# Patient Record
Sex: Female | Born: 2005 | Race: Black or African American | Hispanic: No | Marital: Single | Smoking: Never smoker
Health system: Southern US, Community
[De-identification: ages and names within clinical notes are randomized; demographics above are authoritative.]

## PROBLEM LIST (undated history)

## (undated) DIAGNOSIS — J45909 Unspecified asthma, uncomplicated: Secondary | ICD-10-CM

---
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
## 2023-01-30 DIAGNOSIS — Z419 Encounter for procedure for purposes other than remedying health state, unspecified: Secondary | ICD-10-CM | POA: Diagnosis not present

## 2023-02-08 DIAGNOSIS — J209 Acute bronchitis, unspecified: Secondary | ICD-10-CM | POA: Diagnosis not present

## 2023-02-08 DIAGNOSIS — Z68.41 Body mass index (BMI) pediatric, 5th percentile to less than 85th percentile for age: Secondary | ICD-10-CM | POA: Diagnosis not present

## 2023-02-08 DIAGNOSIS — Z20828 Contact with and (suspected) exposure to other viral communicable diseases: Secondary | ICD-10-CM | POA: Diagnosis not present

## 2023-02-08 DIAGNOSIS — Z1152 Encounter for screening for COVID-19: Secondary | ICD-10-CM | POA: Diagnosis not present

## 2023-02-08 DIAGNOSIS — R0981 Nasal congestion: Secondary | ICD-10-CM | POA: Diagnosis not present

## 2023-03-01 ENCOUNTER — Emergency Department (HOSPITAL_BASED_OUTPATIENT_CLINIC_OR_DEPARTMENT_OTHER)
Admission: EM | Admit: 2023-03-01 | Discharge: 2023-03-01 | Disposition: A | Discharge status: Home / Self Care | Payer: Medicaid Other | Attending: Emergency Medicine | Admitting: Emergency Medicine

## 2023-03-01 ENCOUNTER — Other Ambulatory Visit: Payer: Self-pay

## 2023-03-01 ENCOUNTER — Encounter (HOSPITAL_BASED_OUTPATIENT_CLINIC_OR_DEPARTMENT_OTHER): Payer: Self-pay

## 2023-03-01 DIAGNOSIS — M549 Dorsalgia, unspecified: Secondary | ICD-10-CM | POA: Insufficient documentation

## 2023-03-01 DIAGNOSIS — R1030 Lower abdominal pain, unspecified: Secondary | ICD-10-CM

## 2023-03-01 DIAGNOSIS — R1031 Right lower quadrant pain: Secondary | ICD-10-CM | POA: Diagnosis not present

## 2023-03-01 DIAGNOSIS — R109 Unspecified abdominal pain: Secondary | ICD-10-CM | POA: Diagnosis not present

## 2023-03-01 DIAGNOSIS — J45909 Unspecified asthma, uncomplicated: Secondary | ICD-10-CM | POA: Insufficient documentation

## 2023-03-01 DIAGNOSIS — Z419 Encounter for procedure for purposes other than remedying health state, unspecified: Secondary | ICD-10-CM | POA: Diagnosis not present

## 2023-03-01 LAB — COMPREHENSIVE METABOLIC PANEL
ALT: 13 U/L (ref 0–44)
AST: 23 U/L (ref 15–41)
Albumin: 4.2 g/dL (ref 3.5–5.0)
Alkaline Phosphatase: 46 U/L (ref 38–126)
Anion gap: 10 (ref 5–15)
BUN: 15 mg/dL (ref 6–20)
CO2: 21 mmol/L — ABNORMAL LOW (ref 22–32)
Calcium: 8.9 mg/dL (ref 8.9–10.3)
Chloride: 104 mmol/L (ref 98–111)
Creatinine, Ser: 0.83 mg/dL (ref 0.44–1.00)
GFR, Estimated: >60 mL/min (ref >60)
Glucose, Bld: 117 mg/dL — ABNORMAL HIGH (ref 70–99)
Potassium: 3.5 mmol/L (ref 3.5–5.1)
Sodium: 135 mmol/L (ref 135–145)
Total Bilirubin: 0.7 mg/dL (ref 0.3–1.2)
Total Protein: 7.3 g/dL (ref 6.5–8.1)

## 2023-03-01 LAB — LIPASE, BLOOD: Lipase: 29 U/L (ref 11–51)

## 2023-03-01 LAB — URINALYSIS, ROUTINE W REFLEX MICROSCOPIC
Bilirubin Urine: NEGATIVE
Glucose, UA: NEGATIVE mg/dL
Ketones, ur: NEGATIVE mg/dL
Leukocytes,Ua: NEGATIVE
Nitrite: NEGATIVE
Protein, ur: 30 mg/dL — AB
Specific Gravity, Urine: >=1.03 (ref 1.005–1.030)
pH: 7 (ref 5.0–8.0)

## 2023-03-01 LAB — CBC
HCT: 38.1 % (ref 36.0–46.0)
Hemoglobin: 13.4 g/dL (ref 12.0–15.0)
MCH: 30.7 pg (ref 26.0–34.0)
MCHC: 35.2 g/dL (ref 30.0–36.0)
MCV: 87.4 fL (ref 80.0–100.0)
Platelets: 252 10*3/uL (ref 150–400)
RBC: 4.36 MIL/uL (ref 3.87–5.11)
RDW: 12 % (ref 11.5–15.5)
WBC: 7.4 10*3/uL (ref 4.0–10.5)
nRBC: 0 % (ref 0.0–0.2)

## 2023-03-01 LAB — URINALYSIS, MICROSCOPIC (REFLEX)

## 2023-03-01 LAB — PREGNANCY, URINE: Preg Test, Ur: NEGATIVE

## 2023-03-01 MED ORDER — ONDANSETRON 4 MG PO TBDP: 4.0000 mg | ORAL_TABLET | Freq: Three times a day (TID) | ORAL | 0 refills | Status: AC | PRN

## 2023-03-01 MED ORDER — ACETAMINOPHEN 325 MG PO TABS
ORAL_TABLET | ORAL | Status: AC
  Administered 2023-03-01: 650 mg via ORAL
  Filled 2023-03-01: qty 2

## 2023-03-01 MED ORDER — IBUPROFEN 200 MG PO TABS
ORAL_TABLET | ORAL | Status: AC
  Administered 2023-03-01: 800 mg via ORAL
  Filled 2023-03-01: qty 4

## 2023-03-01 MED ORDER — IBUPROFEN 800 MG PO TABS
800.0000 mg | ORAL_TABLET | Freq: Once | ORAL | Status: AC
  Filled 2023-03-01 (×2): qty 1

## 2023-03-01 MED ORDER — IBUPROFEN 600 MG PO TABS: 600.0000 mg | ORAL_TABLET | Freq: Four times a day (QID) | ORAL | 0 refills | Status: AC | PRN

## 2023-03-01 MED ORDER — ACETAMINOPHEN 325 MG PO TABS
650.0000 mg | ORAL_TABLET | Freq: Once | ORAL | Status: AC
  Filled 2023-03-01 (×2): qty 2

## 2023-03-01 MED ORDER — ONDANSETRON 4 MG PO TBDP
4.0000 mg | ORAL_TABLET | Freq: Once | ORAL | Status: AC | PRN
Start: 2023-03-01 — End: 2023-03-01
  Administered 2023-03-01: 4 mg via ORAL
  Filled 2023-03-01: qty 1

## 2023-03-01 MED ORDER — ACETAMINOPHEN 325 MG PO TABS: 650.0000 mg | ORAL_TABLET | Freq: Four times a day (QID) | ORAL | 0 refills | Status: AC | PRN

## 2023-03-01 NOTE — ED Notes (Signed)
Pt provided with graham crackers, per her request.

## 2023-03-01 NOTE — ED Triage Notes (Signed)
C/o right lower back pain, abdominal pain and emesis starting today. Started menstrual cycle today, states never gets pain like this. Unable to tolerate po.

## 2023-03-01 NOTE — ED Provider Notes (Signed)
[place] [facility] Provider Note   CSN: [id number] Arrival date & time: 03/01/23  1554     History  Chief Complaint  Patient presents with   Abdominal Pain   Back Pain    Hayley Hansen is a 17 y.o. female otherwise healthy presents today for evaluation of right-sided flank pain.  Patient reports that she started to have right-sided flank pain 2 hours ago with the onset of her menstrual cycle.  States she had pain in the same location when she had menstrual period but the pain this time is more intense.  Pain is waxing and waning.  States she is not sexually active. States she has not tried any medication at home.  She endorses nausea and vomiting.  Denies any fever, urinary symptoms, abnormal vaginal discharge.   Abdominal Pain Back Pain Associated symptoms: abdominal pain       Past Medical History:  Diagnosis Date   Asthma    History reviewed. No pertinent surgical history.   Home Medications Prior to Admission medications   Medication Sig Start Date End Date Taking? Authorizing Provider  azithromycin (ZITHROMAX) 250 MG tablet Take by mouth. Patient not taking: Reported on 03/03/2022 12/23/21   [provider]  cetirizine HCl (ZYRTEC) 5 MG/5ML SOLN TK 10 ML PO QD FOR RUNNY NOSE OR ITCHY EYES 02/22/22   Nehemiah Settle, FNP  famotidine (PEPCID) 20 MG tablet TK 1 T PO Q 12 H PRF HEARTBURN 10/03/19   [provider]  fluticasone (FLONASE) 50 MCG/ACT nasal spray 2 SPRAY PER NOSTRIL ONCE A DAY IF NEEDED FOR STUFFY NOSE 02/22/22   Nehemiah Settle, FNP  levalbuterol [facility] HFA) 45 MCG/ACT inhaler Inhale 2 puffs into the lungs every 6 (six) hours as needed for wheezing. 02/22/22   Nehemiah Settle, FNP  levonorgestrel-ethinyl estradiol (SEASONALE) 0.15-0.03 MG tablet Take 1 tablet by mouth daily. 12/06/21   [provider]  meclizine (ANTIVERT) 12.5 MG tablet Take 2 tablets (25 mg total) by mouth 3 (three) times daily as  needed for dizziness. 01/13/22   Roemhildt, Lorin T, PA-C  montelukast (SINGULAIR) 10 MG tablet Take 1 tablet once at night for coughing or wheezing 02/22/22   Nehemiah Settle, FNP  naproxen (NAPROSYN) 500 MG tablet TAKE 1 TABLET BY MOUTH EVERY 12 HOURS FOR TEN DAYS AS NEEDED FOR CRAMPING 11/20/21   [provider]  Olopatadine HCl (PATADAY) 0.2 % SOLN 1 drop each eye once a day if needed for itchy eyes 02/22/22   Nehemiah Settle, FNP  polyethylene glycol powder (GLYCOLAX/MIRALAX) 17 GM/SCOOP powder  10/03/19   [provider]  ranitidine (ZANTAC) 150 MG capsule Take 1 capsule (150 mg total) by mouth daily. 04/23/18   Horton, Mayer Masker, MD  fluticasone (FLOVENT HFA) 44 MCG/ACT inhaler Inhale 2 puffs into the lungs 2 (two) times daily. Patient not taking: Reported on 01/29/2018 07/31/17 02/13/20  Fletcher Anon, MD  levalbuterol [facility]) 1.25 MG/3ML nebulizer solution USE ONE UNIT DOSE EVERY 6 HOURS IF NEEDED FOR COUGH OR WHEEZE Patient not taking: Reported on 07/30/2018 01/31/18 02/13/20  Fletcher Anon, MD      Allergies    Albuterol sulfate    Review of Systems   Review of Systems  Gastrointestinal:  Positive for abdominal pain.  Musculoskeletal:  Positive for back pain.    Physical Exam Updated Vital Signs BP (!) 104/92 (BP Location: Left Arm)   Pulse 87   Temp 98.7 F (37.1 C)   Resp  20   Ht 5\' 4"  (1.626 m)   Wt 54.4 kg   LMP 03/01/2023 (Exact Date)   SpO2 100%   BMI 20.60 kg/m  Physical Exam Vitals and nursing note reviewed.  Constitutional:      Appearance: Normal appearance.  HENT:     Head: Normocephalic and atraumatic.     Mouth/Throat:     Mouth: Mucous membranes are moist.  Eyes:     General: No scleral icterus. Cardiovascular:     Rate and Rhythm: Normal rate and regular rhythm.     Pulses: Normal pulses.     Heart sounds: Normal heart sounds.  Pulmonary:     Effort: Pulmonary effort is normal.     Breath sounds: Normal breath sounds.   Abdominal:     General: Abdomen is flat.     Palpations: Abdomen is soft.     Tenderness: There is no abdominal tenderness.  Musculoskeletal:        General: No deformity.     Comments: Tenderness to palpation to right-sided flank.  Skin:    General: Skin is warm.     Findings: No rash.  Neurological:     General: No focal deficit present.     Mental Status: She is alert.  Psychiatric:        Mood and Affect: Mood normal.     ED Results / Procedures / Treatments   Labs (all labs ordered are listed, but only abnormal results are displayed) Labs Reviewed  COMPREHENSIVE METABOLIC PANEL - Abnormal; Notable for the following components:      Result Value   CO2 21 (*)    Glucose, Bld 117 (*)    All other components within normal limits  URINALYSIS, ROUTINE W REFLEX MICROSCOPIC - Abnormal; Notable for the following components:   Hgb urine dipstick LARGE (*)    Protein, ur 30 (*)    All other components within normal limits  URINALYSIS, MICROSCOPIC (REFLEX) - Abnormal; Notable for the following components:   Bacteria, UA RARE (*)    All other components within normal limits  LIPASE, BLOOD  CBC  PREGNANCY, URINE    EKG None  Radiology No results found.  Procedures Procedures    Medications Ordered in ED Medications  acetaminophen (TYLENOL) tablet 650 mg (has no administration in time range)  ibuprofen (ADVIL) tablet 800 mg (has no administration in time range)  ondansetron (ZOFRAN-ODT) disintegrating tablet 4 mg (4 mg Oral Given 03/01/23 1601)    ED Course/ Medical Decision Making/ A&P                             Medical Decision Making Amount and/or Complexity of Data Reviewed Labs: ordered.  Risk OTC drugs. Prescription drug management.   This patient presents to the ED for right-sided flank pain, this involves an extensive number of treatment options, and is a complaint that carries with a high risk of complications and morbidity.  The differential  diagnosis includes appendicitis, diverticulitis, ovarian torsion, ectopic pregnancy, molar pregnancy, STI, UTI, SBO, infectious etiology.  This is not an exhaustive list.  Lab tests: I ordered and personally interpreted labs.  The pertinent results include: WBC unremarkable. Hbg unremarkable. Platelets unremarkable. Electrolytes unremarkable. BUN, creatinine unremarkable.  Pregnancy test negative.  Problem list/ ED course/ Critical interventions/ Medical management: HPI: See above Vital signs within normal range and stable throughout visit. Laboratory/imaging studies significant for: See above. On physical examination, patient is  afebrile and appears in no acute distress. Patient presents with lower abdominal pain and R flank pain. Abdominal exam without peritoneal signs. No evidence of acute abdomen at this time. Well appearing.  Discussed pelvic exam with patient however she refused at this point. Considered and doubt ovarian torsion given history and presentation. Given work up low suspicion for acute hepatobiliary disease (including acute cholecystitis), acute pancreatitis (neg lipase), PUD and gastric perforation, acute infectious processes (pneumonia, hepatitis, pyelonephritis), acute appendicitis, vascular catastrophe, bowel obstruction or viscus perforation, diverticulitis. Presentation not consistent with other acute, emergent causes of abdominal pain at this time.  I suspect this is musculoskeletal pain.  Given Tylenol, ibuprofen, Zofran.  Reevaluation of patient after this medication showed that the patient improved. Advised patient to take Tylenol/ibuprofen/naproxen for pain, follow-up with primary care physician for further evaluation and management, return to the ER if new or worsening symptoms.  I have reviewed the patient home medicines and have made adjustments as needed.  Cardiac monitoring/EKG: The patient was maintained on a cardiac monitor.  I personally reviewed and interpreted  the cardiac monitor which showed an underlying rhythm of: sinus rhythm.  Additional history obtained: External records from outside source obtained and reviewed including: Chart review including previous notes, labs, imaging.  Consultations obtained:  Disposition Continued outpatient therapy. Follow-up with PCP recommended for reevaluation of symptoms. Treatment plan discussed with patient.  Pt acknowledged understanding was agreeable to the plan. Worrisome signs and symptoms were discussed with patient, and patient acknowledged understanding to return to the ED if they noticed these signs and symptoms. Patient was stable upon discharge.   This chart was dictated using voice recognition software.  Despite best efforts to proofread,  errors can occur which can change the documentation meaning.          Final Clinical Impression(s) / ED Diagnoses Final diagnoses:  Right flank pain  Lower abdominal pain    Rx / DC Orders ED Discharge Orders          Ordered    ondansetron (ZOFRAN-ODT) 4 MG disintegrating tablet  Every 8 hours PRN        03/01/23 1907    acetaminophen (TYLENOL) 325 MG tablet  Every 6 hours PRN        03/01/23 1907    ibuprofen (ADVIL) 600 MG tablet  Every 6 hours PRN        03/01/23 1907              Jeanelle Malling, PA 03/01/23 1913    Tegeler, Canary Brim, MD 03/01/23 [phone]

## 2023-03-01 NOTE — ED Notes (Signed)
D/c paperwork reviewed with pt, including prescriptions and follow up care.  No questions or concerns voiced at time of d/c. . Pt verbalized understanding, Ambulatory with family to ED exit, NAD.   

## 2023-03-01 NOTE — Discharge Instructions (Addendum)
Please take your medications as prescribed. Take tylenol/ibuprofen up to three times a day for pain. I recommend close follow-up with PCP for reevaluation.  Please do not hesitate to return to emergency department if worrisome signs symptoms we discussed become apparent.

## 2023-04-01 DIAGNOSIS — Z419 Encounter for procedure for purposes other than remedying health state, unspecified: Secondary | ICD-10-CM | POA: Diagnosis not present

## 2023-05-01 DIAGNOSIS — Z419 Encounter for procedure for purposes other than remedying health state, unspecified: Secondary | ICD-10-CM | POA: Diagnosis not present

## 2023-06-01 DIAGNOSIS — Z419 Encounter for procedure for purposes other than remedying health state, unspecified: Secondary | ICD-10-CM | POA: Diagnosis not present

## 2023-06-20 ENCOUNTER — Emergency Department (HOSPITAL_BASED_OUTPATIENT_CLINIC_OR_DEPARTMENT_OTHER)
Admission: EM | Admit: 2023-06-20 | Discharge: 2023-06-20 | Disposition: A | Discharge status: Home / Self Care | Payer: Medicaid Other | Attending: Emergency Medicine | Admitting: Emergency Medicine

## 2023-06-20 ENCOUNTER — Encounter (HOSPITAL_BASED_OUTPATIENT_CLINIC_OR_DEPARTMENT_OTHER): Payer: Self-pay | Admitting: Urology

## 2023-06-20 ENCOUNTER — Other Ambulatory Visit: Payer: Self-pay

## 2023-06-20 DIAGNOSIS — B9789 Other viral agents as the cause of diseases classified elsewhere: Secondary | ICD-10-CM | POA: Diagnosis not present

## 2023-06-20 DIAGNOSIS — J028 Acute pharyngitis due to other specified organisms: Secondary | ICD-10-CM | POA: Diagnosis not present

## 2023-06-20 DIAGNOSIS — J029 Acute pharyngitis, unspecified: Secondary | ICD-10-CM | POA: Diagnosis present

## 2023-06-20 DIAGNOSIS — Z20822 Contact with and (suspected) exposure to covid-19: Secondary | ICD-10-CM | POA: Diagnosis not present

## 2023-06-20 LAB — RESP PANEL BY RT-PCR (RSV, FLU A&B, COVID)  RVPGX2
Influenza A by PCR: NEGATIVE
Influenza B by PCR: NEGATIVE
Resp Syncytial Virus by PCR: NEGATIVE
SARS Coronavirus 2 by RT PCR: NEGATIVE

## 2023-06-20 LAB — GROUP A STREP BY PCR: Group A Strep by PCR: NOT DETECTED

## 2023-06-20 MED ORDER — IBUPROFEN 200 MG PO TABS
ORAL_TABLET | ORAL | Status: AC
  Filled 2023-06-20: qty 1

## 2023-06-20 MED ORDER — IBUPROFEN 400 MG PO TABS
600.0000 mg | ORAL_TABLET | Freq: Once | ORAL | Status: AC
  Administered 2023-06-20: 600 mg via ORAL

## 2023-06-20 MED ORDER — IBUPROFEN 400 MG PO TABS
ORAL_TABLET | ORAL | Status: AC
  Filled 2023-06-20: qty 1

## 2023-06-20 NOTE — ED Provider Notes (Signed)
[place] [facility] Provider Note   CSN: [id number] Arrival date & time: 06/20/23  2030     History  Chief Complaint  Patient presents with   Sore Throat    Hayley Hansen is a 17 y.o. female.  Patient woke this morning with a bilateral sore throat and developed back and muscular pain throughout the day. No nausea, vomiting, urinary symptoms, cough or congestion. No known sick contacts. No rash. She denies vaginal discharge. She is otherwise healthy, nonsmoker.   The history is provided by the patient. No language interpreter was used.  Sore Throat       Home Medications Prior to Admission medications   Medication Sig Start Date End Date Taking? Authorizing Provider  acetaminophen (TYLENOL) 325 MG tablet Take 2 tablets (650 mg total) by mouth every 6 (six) hours as needed. 03/01/23   Jeanelle Malling, PA  azithromycin (ZITHROMAX) 250 MG tablet Take by mouth. Patient not taking: Reported on 03/03/2022 12/23/21   [provider]  cetirizine HCl (ZYRTEC) 5 MG/5ML SOLN TK 10 ML PO QD FOR RUNNY NOSE OR ITCHY EYES 02/22/22   Nehemiah Settle, FNP  famotidine (PEPCID) 20 MG tablet TK 1 T PO Q 12 H PRF HEARTBURN 10/03/19   [provider]  fluticasone (FLONASE) 50 MCG/ACT nasal spray 2 SPRAY PER NOSTRIL ONCE A DAY IF NEEDED FOR STUFFY NOSE 02/22/22   Nehemiah Settle, FNP  ibuprofen (ADVIL) 600 MG tablet Take 1 tablet (600 mg total) by mouth every 6 (six) hours as needed. 03/01/23   Jeanelle Malling, PA  levalbuterol [facility] HFA) 45 MCG/ACT inhaler Inhale 2 puffs into the lungs every 6 (six) hours as needed for wheezing. 02/22/22   Nehemiah Settle, FNP  levonorgestrel-ethinyl estradiol (SEASONALE) 0.15-0.03 MG tablet Take 1 tablet by mouth daily. 12/06/21   [provider]  meclizine (ANTIVERT) 12.5 MG tablet Take 2 tablets (25 mg total) by mouth 3 (three) times daily as needed for dizziness. 01/13/22   Roemhildt, Lorin T, PA-C  montelukast (SINGULAIR)  10 MG tablet Take 1 tablet once at night for coughing or wheezing 02/22/22   Nehemiah Settle, FNP  naproxen (NAPROSYN) 500 MG tablet TAKE 1 TABLET BY MOUTH EVERY 12 HOURS FOR TEN DAYS AS NEEDED FOR CRAMPING 11/20/21   [provider]  Olopatadine HCl (PATADAY) 0.2 % SOLN 1 drop each eye once a day if needed for itchy eyes 02/22/22   Nehemiah Settle, FNP  ondansetron (ZOFRAN-ODT) 4 MG disintegrating tablet Take 1 tablet (4 mg total) by mouth every 8 (eight) hours as needed for nausea or vomiting. 03/01/23   Jeanelle Malling, PA  polyethylene glycol powder (GLYCOLAX/MIRALAX) 17 GM/SCOOP powder  10/03/19   [provider]  ranitidine (ZANTAC) 150 MG capsule Take 1 capsule (150 mg total) by mouth daily. 04/23/18   Horton, Mayer Masker, MD  fluticasone (FLOVENT HFA) 44 MCG/ACT inhaler Inhale 2 puffs into the lungs 2 (two) times daily. Patient not taking: Reported on 01/29/2018 07/31/17 02/13/20  Fletcher Anon, MD  levalbuterol (XOPENEX) 1.25 MG/3ML nebulizer solution USE ONE UNIT DOSE EVERY 6 HOURS IF NEEDED FOR COUGH OR WHEEZE Patient not taking: Reported on 07/30/2018 01/31/18 02/13/20  Fletcher Anon, MD      Allergies    Albuterol sulfate    Review of Systems   Review of Systems  Physical Exam Updated Vital Signs BP 102/63 (BP Location: Left Arm)   Pulse (!) 112   Temp 100.2 F (37.9 C) (Oral)  Resp 20   Ht 5\' 4"  (1.626 m)   Wt 53.3 kg   LMP 05/23/2023   SpO2 100%   BMI 20.17 kg/m  Physical Exam Vitals and nursing note reviewed.  Constitutional:      General: She is not in acute distress. HENT:     Nose: No congestion or rhinorrhea.     Mouth/Throat:     Mouth: Mucous membranes are moist.     Pharynx: Posterior oropharyngeal erythema present. No pharyngeal swelling or oropharyngeal exudate.     Tonsils: No tonsillar exudate or tonsillar abscesses.  Cardiovascular:     Rate and Rhythm: Regular rhythm.     Heart sounds: No murmur heard. Pulmonary:     Effort: Pulmonary effort  is normal.     Breath sounds: Normal breath sounds.  Abdominal:     General: There is no distension.     Palpations: Abdomen is soft.  Musculoskeletal:     Cervical back: Normal range of motion.  Lymphadenopathy:     Cervical: No cervical adenopathy.  Skin:    General: Skin is warm and dry.     ED Results / Procedures / Treatments   Labs (all labs ordered are listed, but only abnormal results are displayed) Labs Reviewed  RESP PANEL BY RT-PCR (RSV, FLU A&B, COVID)  [id number]  GROUP A STREP BY PCR    EKG None  Radiology No results found.  Procedures Procedures    Medications Ordered in ED Medications - No data to display  ED Course/ Medical Decision Making/ A&P                                 Medical Decision Making Patient with viral symptoms of sore throat and body aches x 1 day. No fever in ED, mildly tachycardic. Well appearing. Eating and drinking in the room. Viral panel and strep swabs negative.   She is felt stable for discharge home with supportive care. Tylenol and ibuprofen. Push fluids. Return if symptoms worsen.   Amount and/or Complexity of Data Reviewed Labs:  Decision-making details documented in ED Course.           Final Clinical Impression(s) / ED Diagnoses Final diagnoses:  Viral pharyngitis    Rx / DC Orders ED Discharge Orders     None         Danne Harbor 06/20/23 2146    Maia Plan, MD 06/20/23 2358

## 2023-06-20 NOTE — ED Triage Notes (Signed)
Pt states sore throat when waking up today  Since has had back pain and body aches Denies fever

## 2023-07-02 DIAGNOSIS — Z419 Encounter for procedure for purposes other than remedying health state, unspecified: Secondary | ICD-10-CM | POA: Diagnosis not present

## 2023-08-01 DIAGNOSIS — Z419 Encounter for procedure for purposes other than remedying health state, unspecified: Secondary | ICD-10-CM | POA: Diagnosis not present

## 2023-09-01 DIAGNOSIS — Z419 Encounter for procedure for purposes other than remedying health state, unspecified: Secondary | ICD-10-CM | POA: Diagnosis not present

## 2023-09-14 ENCOUNTER — Ambulatory Visit: Payer: Medicaid Other | Admitting: Family

## 2023-10-01 DIAGNOSIS — Z419 Encounter for procedure for purposes other than remedying health state, unspecified: Secondary | ICD-10-CM | POA: Diagnosis not present

## 2023-11-01 DIAGNOSIS — Z419 Encounter for procedure for purposes other than remedying health state, unspecified: Secondary | ICD-10-CM | POA: Diagnosis not present

## 2023-11-03 DIAGNOSIS — Z79899 Other long term (current) drug therapy: Secondary | ICD-10-CM | POA: Diagnosis not present

## 2023-11-03 DIAGNOSIS — R1900 Intra-abdominal and pelvic swelling, mass and lump, unspecified site: Secondary | ICD-10-CM | POA: Diagnosis not present

## 2023-11-03 DIAGNOSIS — Z113 Encounter for screening for infections with a predominantly sexual mode of transmission: Secondary | ICD-10-CM | POA: Diagnosis not present

## 2023-11-03 DIAGNOSIS — R109 Unspecified abdominal pain: Secondary | ICD-10-CM | POA: Diagnosis not present

## 2023-11-03 DIAGNOSIS — K5900 Constipation, unspecified: Secondary | ICD-10-CM | POA: Diagnosis not present

## 2023-11-03 DIAGNOSIS — Z68.41 Body mass index (BMI) pediatric, 5th percentile to less than 85th percentile for age: Secondary | ICD-10-CM | POA: Diagnosis not present

## 2023-11-03 DIAGNOSIS — R1013 Epigastric pain: Secondary | ICD-10-CM | POA: Diagnosis not present

## 2023-11-03 DIAGNOSIS — Z202 Contact with and (suspected) exposure to infections with a predominantly sexual mode of transmission: Secondary | ICD-10-CM | POA: Diagnosis not present

## 2023-11-03 DIAGNOSIS — R8281 Pyuria: Secondary | ICD-10-CM | POA: Diagnosis not present

## 2023-11-03 DIAGNOSIS — R1011 Right upper quadrant pain: Secondary | ICD-10-CM | POA: Diagnosis not present

## 2023-11-07 DIAGNOSIS — M419 Scoliosis, unspecified: Secondary | ICD-10-CM | POA: Diagnosis not present

## 2023-11-07 DIAGNOSIS — K5900 Constipation, unspecified: Secondary | ICD-10-CM | POA: Diagnosis not present

## 2023-11-09 DIAGNOSIS — K5900 Constipation, unspecified: Secondary | ICD-10-CM | POA: Diagnosis not present

## 2023-11-09 DIAGNOSIS — J4530 Mild persistent asthma, uncomplicated: Secondary | ICD-10-CM | POA: Diagnosis not present

## 2023-11-09 DIAGNOSIS — R82998 Other abnormal findings in urine: Secondary | ICD-10-CM | POA: Diagnosis not present

## 2023-11-09 DIAGNOSIS — Z68.41 Body mass index (BMI) pediatric, 5th percentile to less than 85th percentile for age: Secondary | ICD-10-CM | POA: Diagnosis not present

## 2023-11-09 DIAGNOSIS — Z0000 Encounter for general adult medical examination without abnormal findings: Secondary | ICD-10-CM | POA: Diagnosis not present

## 2023-11-09 DIAGNOSIS — Z23 Encounter for immunization: Secondary | ICD-10-CM | POA: Diagnosis not present

## 2023-12-02 DIAGNOSIS — Z419 Encounter for procedure for purposes other than remedying health state, unspecified: Secondary | ICD-10-CM | POA: Diagnosis not present

## 2023-12-28 DIAGNOSIS — R509 Fever, unspecified: Secondary | ICD-10-CM | POA: Diagnosis not present

## 2023-12-28 DIAGNOSIS — R059 Cough, unspecified: Secondary | ICD-10-CM | POA: Diagnosis not present

## 2023-12-28 DIAGNOSIS — B349 Viral infection, unspecified: Secondary | ICD-10-CM | POA: Diagnosis not present

## 2023-12-30 DIAGNOSIS — Z419 Encounter for procedure for purposes other than remedying health state, unspecified: Secondary | ICD-10-CM | POA: Diagnosis not present

## 2024-02-10 DIAGNOSIS — Z419 Encounter for procedure for purposes other than remedying health state, unspecified: Secondary | ICD-10-CM | POA: Diagnosis not present

## 2024-03-11 DIAGNOSIS — Z419 Encounter for procedure for purposes other than remedying health state, unspecified: Secondary | ICD-10-CM | POA: Diagnosis not present

## 2024-04-11 DIAGNOSIS — Z419 Encounter for procedure for purposes other than remedying health state, unspecified: Secondary | ICD-10-CM | POA: Diagnosis not present

## 2024-05-11 DIAGNOSIS — Z419 Encounter for procedure for purposes other than remedying health state, unspecified: Secondary | ICD-10-CM | POA: Diagnosis not present

## 2024-06-11 DIAGNOSIS — Z419 Encounter for procedure for purposes other than remedying health state, unspecified: Secondary | ICD-10-CM | POA: Diagnosis not present

## 2024-06-24 ENCOUNTER — Other Ambulatory Visit: Payer: Self-pay

## 2024-06-24 ENCOUNTER — Encounter (HOSPITAL_BASED_OUTPATIENT_CLINIC_OR_DEPARTMENT_OTHER): Payer: Self-pay | Admitting: Emergency Medicine

## 2024-06-24 ENCOUNTER — Emergency Department (HOSPITAL_BASED_OUTPATIENT_CLINIC_OR_DEPARTMENT_OTHER)

## 2024-06-24 ENCOUNTER — Emergency Department (HOSPITAL_BASED_OUTPATIENT_CLINIC_OR_DEPARTMENT_OTHER): Admission: EM | Admit: 2024-06-24 | Discharge: 2024-06-24 | Disposition: A | Discharge status: Home / Self Care

## 2024-06-24 DIAGNOSIS — J45909 Unspecified asthma, uncomplicated: Secondary | ICD-10-CM | POA: Diagnosis not present

## 2024-06-24 DIAGNOSIS — U071 COVID-19: Secondary | ICD-10-CM | POA: Diagnosis not present

## 2024-06-24 DIAGNOSIS — R059 Cough, unspecified: Secondary | ICD-10-CM | POA: Diagnosis present

## 2024-06-24 LAB — CBC
HCT: 40.4 % (ref 36.0–46.0)
Hemoglobin: 13.9 g/dL (ref 12.0–15.0)
MCH: 30.2 pg (ref 26.0–34.0)
MCHC: 34.4 g/dL (ref 30.0–36.0)
MCV: 87.6 fL (ref 80.0–100.0)
Platelets: 246 K/uL (ref 150–400)
RBC: 4.61 MIL/uL (ref 3.87–5.11)
RDW: 12.1 % (ref 11.5–15.5)
WBC: 5.7 K/uL (ref 4.0–10.5)
nRBC: 0 % (ref 0.0–0.2)

## 2024-06-24 LAB — BASIC METABOLIC PANEL WITH GFR
Anion gap: 12 (ref 5–15)
BUN: 13 mg/dL (ref 6–20)
CO2: 24 mmol/L (ref 22–32)
Calcium: 9.6 mg/dL (ref 8.9–10.3)
Chloride: 105 mmol/L (ref 98–111)
Creatinine, Ser: 0.91 mg/dL (ref 0.44–1.00)
GFR, Estimated: >60 mL/min (ref >60)
Glucose, Bld: 93 mg/dL (ref 70–99)
Potassium: 3.9 mmol/L (ref 3.5–5.1)
Sodium: 142 mmol/L (ref 135–145)

## 2024-06-24 LAB — RESP PANEL BY RT-PCR (RSV, FLU A&B, COVID)  RVPGX2
Influenza A by PCR: NEGATIVE
Influenza B by PCR: NEGATIVE
Resp Syncytial Virus by PCR: NEGATIVE
SARS Coronavirus 2 by RT PCR: POSITIVE — AB

## 2024-06-24 LAB — PREGNANCY, URINE: Preg Test, Ur: NEGATIVE

## 2024-06-24 LAB — TROPONIN T, HIGH SENSITIVITY
Troponin T High Sensitivity: <15 ng/L (ref 0–19)
Troponin T High Sensitivity: <15 ng/L (ref 0–19)

## 2024-06-24 MED ORDER — ALBUTEROL SULFATE HFA 108 (90 BASE) MCG/ACT IN AERS: 2.0000 | INHALATION_SPRAY | RESPIRATORY_TRACT | Status: DC | PRN

## 2024-06-24 MED ORDER — PREDNISONE 50 MG PO TABS
60.0000 mg | ORAL_TABLET | Freq: Once | ORAL | Status: AC
  Administered 2024-06-24: 60 mg via ORAL
  Filled 2024-06-24: qty 1

## 2024-06-24 MED ORDER — ALBUTEROL SULFATE HFA 108 (90 BASE) MCG/ACT IN AERS
INHALATION_SPRAY | RESPIRATORY_TRACT | Status: AC
  Administered 2024-06-24: 2 via RESPIRATORY_TRACT
  Filled 2024-06-24: qty 6.7

## 2024-06-24 MED ORDER — ALBUTEROL SULFATE HFA 108 (90 BASE) MCG/ACT IN AERS: 1.0000 | INHALATION_SPRAY | Freq: Four times a day (QID) | RESPIRATORY_TRACT | 0 refills | Status: AC | PRN

## 2024-06-24 MED ORDER — PREDNISONE 50 MG PO TABS
50.0000 mg | ORAL_TABLET | Freq: Every day | ORAL | 0 refills | Status: AC
Start: 2024-06-24 — End: 2024-06-28

## 2024-06-24 NOTE — ED Triage Notes (Signed)
 Pt c/o shob since this AM, denies fever. Been out of home mdi for extended period. Reports non productive cough. Reports intermittent chest pain/burning with cough.  Poor po intake.   RT to triage to assess.

## 2024-06-24 NOTE — Discharge Instructions (Signed)
 It was a pleasure taking care of you here today.  Your COVID test is positive which is likely the cause of your symptoms.  I have written you for a short course of steroids.  Use your inhaler every 4 hours as needed  Return for any worsening symptoms

## 2024-06-24 NOTE — ED Provider Notes (Signed)
 [place] [facility] Provider Note   CSN: [id number] Arrival date & time: 06/24/24  1850     Patient presents with: Shortness of Breath   Hayley Hansen is a 18 y.o. female here for evaluation of shortness of breath and cough.  History of asthma has been out of rescue inhaler for extended period of time.  Cough nonproductive.  Started middle of the night.  Intermittent chest pain when coughing however no pain with exertion or pleuritic chest pain.  Has had overall decreased appetite.  No fever, nausea, vomiting, back pain, dysuria hematuria.  No history of PE or DVT.  No pain or swelling to lower extremities.  No meds PTA.   HPI     Prior to Admission medications   Medication Sig Start Date End Date Taking? Authorizing Provider  albuterol  (VENTOLIN  HFA) 108 (90 Base) MCG/ACT inhaler Inhale 1-2 puffs into the lungs every 6 (six) hours as needed for wheezing or shortness of breath. 06/24/24  Yes Chantella Creech A, PA-C  predniSONE  (DELTASONE ) 50 MG tablet Take 1 tablet (50 mg total) by mouth daily for 4 days. 06/24/24 06/28/24 Yes Yama Nielson A, PA-C  acetaminophen  (TYLENOL ) 325 MG tablet Take 2 tablets (650 mg total) by mouth every 6 (six) hours as needed. 03/01/23   Ladora Congress, PA  azithromycin (ZITHROMAX) 250 MG tablet Take by mouth. Patient not taking: Reported on 03/03/2022 12/23/21   [provider]  cetirizine  HCl (ZYRTEC ) 5 MG/5ML SOLN TK 10 ML PO QD FOR RUNNY NOSE OR ITCHY EYES 02/22/22   Cheryl Reusing, FNP  famotidine  (PEPCID ) 20 MG tablet TK 1 T PO Q 12 H PRF HEARTBURN 10/03/19   [provider]  fluticasone  (FLONASE ) 50 MCG/ACT nasal spray 2 SPRAY PER NOSTRIL ONCE A DAY IF NEEDED FOR STUFFY NOSE 02/22/22   Cheryl Reusing, FNP  ibuprofen  (ADVIL ) 600 MG tablet Take 1 tablet (600 mg total) by mouth every 6 (six) hours as needed. 03/01/23   Ladora Congress, PA  levalbuterol  (XOPENEX  HFA) 45 MCG/ACT inhaler Inhale 2 puffs into the lungs every  6 (six) hours as needed for wheezing. 02/22/22   Cheryl Reusing, FNP  levonorgestrel-ethinyl estradiol (SEASONALE) 0.15-0.03 MG tablet Take 1 tablet by mouth daily. 12/06/21   [provider]  meclizine  (ANTIVERT ) 12.5 MG tablet Take 2 tablets (25 mg total) by mouth 3 (three) times daily as needed for dizziness. 01/13/22   Roemhildt, Lorin T, PA-C  montelukast  (SINGULAIR ) 10 MG tablet Take 1 tablet once at night for coughing or wheezing 02/22/22   Cheryl Reusing, FNP  naproxen  (NAPROSYN ) 500 MG tablet TAKE 1 TABLET BY MOUTH EVERY 12 HOURS FOR TEN DAYS AS NEEDED FOR CRAMPING 11/20/21   [provider]  Olopatadine  HCl (PATADAY ) 0.2 % SOLN 1 drop each eye once a day if needed for itchy eyes 02/22/22   Cheryl Reusing, FNP  ondansetron  (ZOFRAN -ODT) 4 MG disintegrating tablet Take 1 tablet (4 mg total) by mouth every 8 (eight) hours as needed for nausea or vomiting. 03/01/23   Ladora Congress, PA  polyethylene glycol powder (GLYCOLAX /MIRALAX ) 17 GM/SCOOP powder  10/03/19   [provider]  ranitidine  (ZANTAC ) 150 MG capsule Take 1 capsule (150 mg total) by mouth daily. 04/23/18   Horton, Charmaine FALCON, MD  fluticasone  (FLOVENT  HFA) 44 MCG/ACT inhaler Inhale 2 puffs into the lungs 2 (two) times daily. Patient not taking: Reported on 01/29/2018 07/31/17 02/13/20  Asa Aloysius LABOR, MD  levalbuterol  (XOPENEX ) 1.25 MG/3ML nebulizer  solution USE ONE UNIT DOSE EVERY 6 HOURS IF NEEDED FOR COUGH OR WHEEZE Patient not taking: Reported on 07/30/2018 01/31/18 02/13/20  Asa Aloysius LABOR, MD    Allergies: Albuterol  sulfate    Review of Systems  Constitutional:  Positive for activity change, appetite change and fatigue.  HENT:  Negative for sore throat and trouble swallowing.   Respiratory:  Positive for cough, shortness of breath and wheezing.   Cardiovascular: Negative.   Gastrointestinal: Negative.   Genitourinary: Negative.   Musculoskeletal: Negative.   Skin: Negative.   Neurological: Negative.   All  other systems reviewed and are negative.   Updated Vital Signs BP 107/74   Pulse 82   Temp 98 F (36.7 C)   Resp 18   Ht 5' 4 (1.626 m)   LMP 06/21/2024 (Approximate)   SpO2 100%   BMI 20.17 kg/m   Physical Exam Vitals and nursing note reviewed.  Constitutional:      General: She is not in acute distress.    Appearance: She is well-developed. She is not ill-appearing, toxic-appearing or diaphoretic.  HENT:     Head: Normocephalic and atraumatic.  Eyes:     Pupils: Pupils are equal, round, and reactive to light.  Cardiovascular:     Rate and Rhythm: Normal rate.     Pulses: Normal pulses.     Heart sounds: Normal heart sounds.  Pulmonary:     Effort: Pulmonary effort is normal. No respiratory distress.     Breath sounds: Normal breath sounds.     Comments: Clear bilaterally, speaks in full sentences without difficulty Chest:     Comments: Nontender chest wall Abdominal:     General: Bowel sounds are normal. There is no distension.     Palpations: Abdomen is soft.     Tenderness: There is no abdominal tenderness. There is no guarding or rebound.     Comments: Soft, nontender, no rebound or guarding  Musculoskeletal:        General: Normal range of motion.     Cervical back: Normal range of motion.     Right lower leg: No tenderness. No edema.     Left lower leg: No tenderness. No edema.     Comments: No bony tenderness, compartments soft, full range of motion  Skin:    General: Skin is warm and dry.  Neurological:     General: No focal deficit present.     Mental Status: She is alert.  Psychiatric:        Mood and Affect: Mood normal.     (all labs ordered are listed, but only abnormal results are displayed) Labs Reviewed  RESP PANEL BY RT-PCR (RSV, FLU A&B, COVID)  RVPGX2 - Abnormal; Notable for the following components:      Result Value   SARS Coronavirus 2 by RT PCR POSITIVE (*)    All other components within normal limits  PREGNANCY, URINE  BASIC  METABOLIC PANEL WITH GFR  CBC  TROPONIN T, HIGH SENSITIVITY  TROPONIN T, HIGH SENSITIVITY    EKG: None  Radiology: DG Chest 2 View Result Date: 06/24/2024 CLINICAL DATA:  Shortness of breath since this morning. Nonproductive cough. Chest pain and burning. History of asthma. EXAM: CHEST - 2 VIEW COMPARISON:  11/07/2023 FINDINGS: The heart size and mediastinal contours are within normal limits. Both lungs are clear. The visualized skeletal structures are unremarkable. IMPRESSION: No active cardiopulmonary disease. Electronically Signed   By: Elsie Gravely M.D.   On: 06/24/2024 20:17  Procedures   Medications Ordered in the ED  albuterol  (VENTOLIN  HFA) 108 (90 Base) MCG/ACT inhaler 2 puff (2 puffs Inhalation Given 06/24/24 1906)  predniSONE  (DELTASONE ) tablet 60 mg (60 mg Oral Given 06/24/24 712)   18 year old here for evaluation of cough and shortness of breath.  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  Reportedly was seen by respiratory therapist in triage and was wheezing.  Was given albuterol  treatment which improved symptoms.  When I initially evaluated patient she had no recurrent wheeze.  She is no clinical evidence of DVT.  Does admit to some chest pain only with coughing.  Nonexertional, nonpleuritic in nature.  She is PERC negative, Wells criteria low risk.  Workup started from triage.  Labs and imaging personally viewed and  interpreted:  EKG without ischemic changes CBC without leukocytosis Metabolic panel without significant abnormality Troponin <15 x2 flat delta Chest x-ray without cardiomegaly, pulm edema, pneumothorax Viral panel positive for COVID  Patient reassessed.  We discussed labs and imaging.  Patient given prednisone  for asthma exacerbation as well as concomitant COVID infection.  She was given albuterol  MDI to take home with her.  Will have her follow-up outpatient, return for any worsening symptoms  The patient has been appropriately medically screened  and/or stabilized in the ED. I have low suspicion for any other emergent medical condition which would require further screening, evaluation or treatment in the ED or require inpatient management.  Low suspicion for secondary bacterial infection, PE, pneumothorax, fluid overload, persistent asthma exacerbation, hypoxic respiratory failure, myocarditis, pericarditis, endocarditis  Patient is hemodynamically stable and in no acute distress.  Patient able to ambulate in department prior to ED.  Evaluation does not show acute pathology that would require ongoing or additional emergent interventions while in the emergency department or further inpatient treatment.  I have discussed the diagnosis with the patient and answered all questions.  Pain is been managed while in the emergency department and patient has no further complaints prior to discharge.  Patient is comfortable with plan discussed in room and is stable for discharge at this time.  I have discussed strict return precautions for returning to the emergency department.  Patient was encouraged to follow-up with PCP/specialist refer to at discharge.                                    Medical Decision Making Amount and/or Complexity of Data Reviewed Independent Historian: friend External Data Reviewed: labs, radiology, ECG and notes. Labs: ordered. Decision-making details documented in ED Course. Radiology: ordered and independent interpretation performed. Decision-making details documented in ED Course. ECG/medicine tests: ordered and independent interpretation performed. Decision-making details documented in ED Course.  Risk OTC drugs. Prescription drug management. Decision regarding hospitalization. Diagnosis or treatment significantly limited by social determinants of health.        Final diagnoses:  COVID    ED Discharge Orders          Ordered    predniSONE  (DELTASONE ) 50 MG tablet  Daily        06/24/24 2130     albuterol  (VENTOLIN  HFA) 108 (90 Base) MCG/ACT inhaler  Every 6 hours PRN        06/24/24 2130               Jini Horiuchi A, PA-C 06/24/24 2133    Gennaro Bouchard L, DO 06/25/24 1325

## 2024-07-12 DIAGNOSIS — Z419 Encounter for procedure for purposes other than remedying health state, unspecified: Secondary | ICD-10-CM | POA: Diagnosis not present

## 2024-09-27 ENCOUNTER — Other Ambulatory Visit: Payer: Self-pay

## 2024-09-27 ENCOUNTER — Emergency Department (HOSPITAL_BASED_OUTPATIENT_CLINIC_OR_DEPARTMENT_OTHER)
Admission: EM | Admit: 2024-09-27 | Discharge: 2024-09-27 | Disposition: A | Discharge status: Home / Self Care | Attending: Emergency Medicine | Admitting: Emergency Medicine

## 2024-09-27 ENCOUNTER — Encounter (HOSPITAL_BASED_OUTPATIENT_CLINIC_OR_DEPARTMENT_OTHER): Payer: Self-pay | Admitting: Emergency Medicine

## 2024-09-27 DIAGNOSIS — B349 Viral infection, unspecified: Secondary | ICD-10-CM | POA: Insufficient documentation

## 2024-09-27 DIAGNOSIS — R519 Headache, unspecified: Secondary | ICD-10-CM | POA: Diagnosis present

## 2024-09-27 LAB — RESP PANEL BY RT-PCR (RSV, FLU A&B, COVID)  RVPGX2
Influenza A by PCR: NEGATIVE
Influenza B by PCR: NEGATIVE
Resp Syncytial Virus by PCR: NEGATIVE
SARS Coronavirus 2 by RT PCR: NEGATIVE

## 2024-09-27 LAB — PREGNANCY, URINE: Preg Test, Ur: NEGATIVE

## 2024-09-27 MED ORDER — ONDANSETRON 4 MG PO TBDP
8.0000 mg | ORAL_TABLET | Freq: Once | ORAL | Status: AC
  Administered 2024-09-27: 8 mg via ORAL
  Filled 2024-09-27: qty 2

## 2024-09-27 MED ORDER — ONDANSETRON 8 MG PO TBDP: ORAL_TABLET | ORAL | 0 refills | Status: AC

## 2024-09-27 MED ORDER — ACETAMINOPHEN 500 MG PO TABS
1000.0000 mg | ORAL_TABLET | Freq: Once | ORAL | Status: AC
  Administered 2024-09-27: 1000 mg via ORAL
  Filled 2024-09-27: qty 2

## 2024-09-27 NOTE — ED Provider Notes (Signed)
 [place] [facility] Provider Note   CSN: [id number] Arrival date & time: 09/27/24  9572     Patient presents with: Emesis and Headache   Hayley Hansen is a 18 y.o. female.   The history is provided by the patient.  Emesis Severity:  Moderate Duration: hours. Timing:  Rare Quality:  Stomach contents Progression:  Unchanged Chronicity:  New Recent urination:  Normal Context: not post-tussive   Relieved by:  Nothing Worsened by:  Nothing Ineffective treatments:  None tried Associated symptoms: headaches   Headache Associated symptoms: nausea and vomiting        Prior to Admission medications   Medication Sig Start Date End Date Taking? Authorizing Provider  ondansetron  (ZOFRAN -ODT) 8 MG disintegrating tablet 8mg  ODT q8 hours prn nausea 09/27/24  Yes Kaisyn Millea, MD  acetaminophen  (TYLENOL ) 325 MG tablet Take 2 tablets (650 mg total) by mouth every 6 (six) hours as needed. 03/01/23   Ladora Congress, PA  albuterol  (VENTOLIN  HFA) 108 (90 Base) MCG/ACT inhaler Inhale 1-2 puffs into the lungs every 6 (six) hours as needed for wheezing or shortness of breath. 06/24/24   Henderly, Britni A, PA-C  azithromycin (ZITHROMAX) 250 MG tablet Take by mouth. Patient not taking: Reported on 03/03/2022 12/23/21   [provider]  cetirizine  HCl (ZYRTEC ) 5 MG/5ML SOLN TK 10 ML PO QD FOR RUNNY NOSE OR ITCHY EYES 02/22/22   Cheryl Reusing, FNP  famotidine  (PEPCID ) 20 MG tablet TK 1 T PO Q 12 H PRF HEARTBURN 10/03/19   [provider]  fluticasone  (FLONASE ) 50 MCG/ACT nasal spray 2 SPRAY PER NOSTRIL ONCE A DAY IF NEEDED FOR STUFFY NOSE 02/22/22   Cheryl Reusing, FNP  ibuprofen  (ADVIL ) 600 MG tablet Take 1 tablet (600 mg total) by mouth every 6 (six) hours as needed. 03/01/23   Ladora Congress, PA  levalbuterol  (XOPENEX  HFA) 45 MCG/ACT inhaler Inhale 2 puffs into the lungs every 6 (six) hours as needed for wheezing. 02/22/22   Cheryl Reusing, FNP   levonorgestrel-ethinyl estradiol (SEASONALE) 0.15-0.03 MG tablet Take 1 tablet by mouth daily. 12/06/21   [provider]  meclizine  (ANTIVERT ) 12.5 MG tablet Take 2 tablets (25 mg total) by mouth 3 (three) times daily as needed for dizziness. 01/13/22   Roemhildt, Lorin T, PA-C  montelukast  (SINGULAIR ) 10 MG tablet Take 1 tablet once at night for coughing or wheezing 02/22/22   Cheryl Reusing, FNP  naproxen  (NAPROSYN ) 500 MG tablet TAKE 1 TABLET BY MOUTH EVERY 12 HOURS FOR TEN DAYS AS NEEDED FOR CRAMPING 11/20/21   [provider]  Olopatadine  HCl (PATADAY ) 0.2 % SOLN 1 drop each eye once a day if needed for itchy eyes 02/22/22   Cheryl Reusing, FNP  ondansetron  (ZOFRAN -ODT) 4 MG disintegrating tablet Take 1 tablet (4 mg total) by mouth every 8 (eight) hours as needed for nausea or vomiting. 03/01/23   Ladora Congress, PA  polyethylene glycol powder (GLYCOLAX /MIRALAX ) 17 GM/SCOOP powder  10/03/19   [provider]  ranitidine  (ZANTAC ) 150 MG capsule Take 1 capsule (150 mg total) by mouth daily. 04/23/18   Horton, Charmaine FALCON, MD  fluticasone  (FLOVENT  HFA) 44 MCG/ACT inhaler Inhale 2 puffs into the lungs 2 (two) times daily. Patient not taking: Reported on 01/29/2018 07/31/17 02/13/20  Asa Aloysius LABOR, MD  levalbuterol  (XOPENEX ) 1.25 MG/3ML nebulizer solution USE ONE UNIT DOSE EVERY 6 HOURS IF NEEDED FOR COUGH OR WHEEZE Patient not taking: Reported on 07/30/2018 01/31/18 02/13/20  Asa Aloysius LABOR,  MD    Allergies: Patient has no active allergies.    Review of Systems  Gastrointestinal:  Positive for nausea and vomiting.  Neurological:  Positive for headaches.  All other systems reviewed and are negative.   Updated Vital Signs BP 102/60   Pulse 68   Temp 98.1 F (36.7 C) (Oral)   Resp 20   Ht 5' 5 (1.651 m)   Wt 54.4 kg   LMP 09/02/2024   SpO2 100%   BMI 19.97 kg/m   Physical Exam Vitals and nursing note reviewed.  Constitutional:      General: She is not in acute  distress.    Appearance: Normal appearance. She is well-developed.  HENT:     Head: Normocephalic and atraumatic.     Nose: Nose normal.  Eyes:     Pupils: Pupils are equal, round, and reactive to light.  Cardiovascular:     Rate and Rhythm: Normal rate and regular rhythm.     Pulses: Normal pulses.     Heart sounds: Normal heart sounds.  Pulmonary:     Effort: Pulmonary effort is normal. No respiratory distress.     Breath sounds: Normal breath sounds.  Abdominal:     General: Bowel sounds are normal. There is no distension.     Palpations: Abdomen is soft.     Tenderness: There is no abdominal tenderness. There is no guarding or rebound.  Genitourinary:    Vagina: No vaginal discharge.  Musculoskeletal:        General: Normal range of motion.     Cervical back: Normal range of motion and neck supple. No rigidity.  Lymphadenopathy:     Cervical: No cervical adenopathy.  Skin:    General: Skin is dry.     Capillary Refill: Capillary refill takes less than 2 seconds.     Findings: No erythema or rash.  Neurological:     General: No focal deficit present.     Mental Status: She is alert.     Deep Tendon Reflexes: Reflexes normal.  Psychiatric:        Mood and Affect: Mood normal.     (all labs ordered are listed, but only abnormal results are displayed) Labs Reviewed  RESP PANEL BY RT-PCR (RSV, FLU A&B, COVID)  RVPGX2  PREGNANCY, URINE    EKG: None  Radiology: No results found.   Procedures   Medications Ordered in the ED  acetaminophen  (TYLENOL ) tablet 1,000 mg (1,000 mg Oral Given 09/27/24 0555)  ondansetron  (ZOFRAN -ODT) disintegrating tablet 8 mg (8 mg Oral Given 09/27/24 0600)                                    Medical Decision Making Patient with headache and emesis that just started PTA  Amount and/or Complexity of Data Reviewed External Data Reviewed: notes.    Details: Previous notes reviewed  Labs: ordered.    Details: Negative covid and flu,  pregnancy is negative   Risk OTC drugs. Prescription drug management. Risk Details: Patient is well appear.  Symptoms consistent with viral illness. PO challenged successfully, stable for discharge.      Final diagnoses:  Viral illness  No signs of systemic illness or infection. The patient is nontoxic-appearing on exam and vital signs are within normal limits.  I have reviewed the triage vital signs and the nursing notes. Pertinent labs & imaging results that were available during my  care of the patient were reviewed by me and considered in my medical decision making (see chart for details). After history, exam, and medical workup I feel the patient has been appropriately medically screened and is safe for discharge home. Pertinent diagnoses were discussed with the patient. Patient was given return precautions.     ED Discharge Orders          Ordered    ondansetron  (ZOFRAN -ODT) 8 MG disintegrating tablet        09/27/24 0623               Rosina Cressler, MD 09/27/24 9261

## 2024-09-27 NOTE — ED Triage Notes (Signed)
 Headache started yesterday, emesis started about 1.5 hour ago. Denies Hx or injury. No meds at home.

## 2024-09-30 ENCOUNTER — Emergency Department (HOSPITAL_BASED_OUTPATIENT_CLINIC_OR_DEPARTMENT_OTHER)

## 2024-09-30 ENCOUNTER — Emergency Department (HOSPITAL_BASED_OUTPATIENT_CLINIC_OR_DEPARTMENT_OTHER)
Admission: EM | Admit: 2024-09-30 | Discharge: 2024-09-30 | Disposition: A | Discharge status: Home / Self Care | Attending: Emergency Medicine | Admitting: Emergency Medicine

## 2024-09-30 ENCOUNTER — Encounter (HOSPITAL_BASED_OUTPATIENT_CLINIC_OR_DEPARTMENT_OTHER): Payer: Self-pay

## 2024-09-30 DIAGNOSIS — K5900 Constipation, unspecified: Secondary | ICD-10-CM | POA: Diagnosis not present

## 2024-09-30 DIAGNOSIS — R000 Tachycardia, unspecified: Secondary | ICD-10-CM | POA: Insufficient documentation

## 2024-09-30 DIAGNOSIS — T7840XA Allergy, unspecified, initial encounter: Secondary | ICD-10-CM | POA: Diagnosis not present

## 2024-09-30 DIAGNOSIS — N898 Other specified noninflammatory disorders of vagina: Secondary | ICD-10-CM | POA: Diagnosis not present

## 2024-09-30 DIAGNOSIS — M25511 Pain in right shoulder: Secondary | ICD-10-CM | POA: Insufficient documentation

## 2024-09-30 DIAGNOSIS — R109 Unspecified abdominal pain: Secondary | ICD-10-CM | POA: Diagnosis present

## 2024-09-30 DIAGNOSIS — Z8616 Personal history of COVID-19: Secondary | ICD-10-CM | POA: Diagnosis not present

## 2024-09-30 LAB — CBC WITH DIFFERENTIAL/PLATELET
Abs Immature Granulocytes: 0.02 K/uL (ref 0.00–0.07)
Basophils Absolute: 0 K/uL (ref 0.0–0.1)
Basophils Relative: 0 %
Eosinophils Absolute: 0.1 K/uL (ref 0.0–0.5)
Eosinophils Relative: 2 %
HCT: 38.4 % (ref 36.0–46.0)
Hemoglobin: 13.4 g/dL (ref 12.0–15.0)
Immature Granulocytes: 0 %
Lymphocytes Relative: 42 %
Lymphs Abs: 2.9 K/uL (ref 0.7–4.0)
MCH: 30.5 pg (ref 26.0–34.0)
MCHC: 34.9 g/dL (ref 30.0–36.0)
MCV: 87.3 fL (ref 80.0–100.0)
Monocytes Absolute: 0.8 K/uL (ref 0.1–1.0)
Monocytes Relative: 11 %
Neutro Abs: 3.1 K/uL (ref 1.7–7.7)
Neutrophils Relative %: 45 %
Platelets: 263 K/uL (ref 150–400)
RBC: 4.4 MIL/uL (ref 3.87–5.11)
RDW: 12.1 % (ref 11.5–15.5)
WBC: 6.9 K/uL (ref 4.0–10.5)
nRBC: 0 % (ref 0.0–0.2)

## 2024-09-30 LAB — BASIC METABOLIC PANEL WITH GFR
Anion gap: 9 (ref 5–15)
BUN: 12 mg/dL (ref 6–20)
CO2: 23 mmol/L (ref 22–32)
Calcium: 8.6 mg/dL — ABNORMAL LOW (ref 8.9–10.3)
Chloride: 107 mmol/L (ref 98–111)
Creatinine, Ser: 0.73 mg/dL (ref 0.44–1.00)
GFR, Estimated: >60 mL/min (ref >60)
Glucose, Bld: 105 mg/dL — ABNORMAL HIGH (ref 70–99)
Potassium: 3.5 mmol/L (ref 3.5–5.1)
Sodium: 139 mmol/L (ref 135–145)

## 2024-09-30 LAB — HCG, SERUM, QUALITATIVE: Preg, Serum: NEGATIVE

## 2024-09-30 MED ORDER — FAMOTIDINE 20 MG PO TABS: 20.0000 mg | ORAL_TABLET | Freq: Two times a day (BID) | ORAL | 0 refills | Status: AC

## 2024-09-30 MED ORDER — LIDOCAINE 5 % EX PTCH
1.0000 | MEDICATED_PATCH | CUTANEOUS | Status: DC
  Administered 2024-09-30: 1 via TRANSDERMAL
  Filled 2024-09-30: qty 1

## 2024-09-30 MED ORDER — METHYLPREDNISOLONE SODIUM SUCC 125 MG IJ SOLR
INTRAMUSCULAR | Status: AC
  Administered 2024-09-30: 125 mg via INTRAVENOUS
  Filled 2024-09-30: qty 2

## 2024-09-30 MED ORDER — FAMOTIDINE IN NACL 20-0.9 MG/50ML-% IV SOLN
20.0000 mg | Freq: Once | INTRAVENOUS | Status: AC
  Administered 2024-09-30: 20 mg via INTRAVENOUS

## 2024-09-30 MED ORDER — LIDOCAINE 5 % EX PTCH: 1.0000 | MEDICATED_PATCH | CUTANEOUS | 0 refills | Status: DC

## 2024-09-30 MED ORDER — IOHEXOL 350 MG/ML SOLN
75.0000 mL | Freq: Once | INTRAVENOUS | Status: AC | PRN
  Administered 2024-09-30: 75 mL via INTRAVENOUS

## 2024-09-30 MED ORDER — DIPHENHYDRAMINE HCL 50 MG/ML IJ SOLN: 25.0000 mg | Freq: Once | INTRAMUSCULAR | Status: AC

## 2024-09-30 MED ORDER — SODIUM CHLORIDE 0.9 % IV BOLUS
500.0000 mL | Freq: Once | INTRAVENOUS | Status: AC
  Administered 2024-09-30: 500 mL via INTRAVENOUS

## 2024-09-30 MED ORDER — PREDNISONE 20 MG PO TABS: ORAL_TABLET | ORAL | 0 refills | Status: AC

## 2024-09-30 MED ORDER — SODIUM CHLORIDE 0.9 % IV BOLUS
1000.0000 mL | Freq: Once | INTRAVENOUS | Status: AC
  Administered 2024-09-30: 1000 mL via INTRAVENOUS

## 2024-09-30 MED ORDER — FAMOTIDINE IN NACL 20-0.9 MG/50ML-% IV SOLN
INTRAVENOUS | Status: DC
  Filled 2024-09-30: qty 50

## 2024-09-30 MED ORDER — IBUPROFEN 400 MG PO TABS
400.0000 mg | ORAL_TABLET | Freq: Once | ORAL | Status: AC
  Administered 2024-09-30: 400 mg via ORAL
  Filled 2024-09-30: qty 1

## 2024-09-30 MED ORDER — DIPHENHYDRAMINE HCL 50 MG/ML IJ SOLN
INTRAMUSCULAR | Status: AC
  Administered 2024-09-30: 25 mg via INTRAVENOUS
  Filled 2024-09-30: qty 1

## 2024-09-30 MED ORDER — METHYLPREDNISOLONE SODIUM SUCC 125 MG IJ SOLR: 125.0000 mg | INTRAMUSCULAR | Status: AC

## 2024-09-30 MED ORDER — ACETAMINOPHEN 500 MG PO TABS
1000.0000 mg | ORAL_TABLET | Freq: Once | ORAL | Status: AC
  Administered 2024-09-30: 1000 mg via ORAL
  Filled 2024-09-30: qty 2

## 2024-09-30 NOTE — ED Provider Notes (Addendum)
 [place] [facility]MENT AT [facility] [place] Provider Note   CSN: [id number] Arrival date & time: 09/30/24  0204     Patient presents with: No chief complaint on file.   Hayley Hansen is a 18 y.o. female.   The history is provided by the patient.  Shoulder Pain Location:  Shoulder Shoulder location:  R shoulder Injury: no (no known)   Pain details:    Quality:  Aching   Severity:  Moderate   Onset quality:  Sudden   Duration: 35 minutes.   Timing:  Constant   Progression:  Unchanged Dislocation: no   Prior injury to area:  No Relieved by:  Nothing Worsened by:  Nothing Ineffective treatments:  None tried Associated symptoms: no decreased range of motion   Risk factors: no concern for non-accidental trauma   Patient presents with sudden onset SOB and R shoulder pain and pain with inspiration with recent COVID infection.  Patient denies trauma to the right shoulder.  No long travel.  No OCP.  No leg pain or swelling.       Prior to Admission medications   Medication Sig Start Date End Date Taking? Authorizing Provider  acetaminophen  (TYLENOL ) 325 MG tablet Take 2 tablets (650 mg total) by mouth every 6 (six) hours as needed. 03/01/23   Ladora Congress, PA  albuterol  (VENTOLIN  HFA) 108 (90 Base) MCG/ACT inhaler Inhale 1-2 puffs into the lungs every 6 (six) hours as needed for wheezing or shortness of breath. 06/24/24   Henderly, Britni A, PA-C  azithromycin (ZITHROMAX) 250 MG tablet Take by mouth. Patient not taking: Reported on 03/03/2022 12/23/21   [provider]  cetirizine  HCl (ZYRTEC ) 5 MG/5ML SOLN TK 10 ML PO QD FOR RUNNY NOSE OR ITCHY EYES 02/22/22   Cheryl Reusing, FNP  famotidine  (PEPCID ) 20 MG tablet TK 1 T PO Q 12 H PRF HEARTBURN 10/03/19   [provider]  fluticasone  (FLONASE ) 50 MCG/ACT nasal spray 2 SPRAY PER NOSTRIL ONCE A DAY IF NEEDED FOR STUFFY NOSE 02/22/22   Cheryl Reusing, FNP  ibuprofen  (ADVIL ) 600 MG tablet Take 1 tablet (600 mg  total) by mouth every 6 (six) hours as needed. 03/01/23   Ladora Congress, PA  levalbuterol  (XOPENEX  HFA) 45 MCG/ACT inhaler Inhale 2 puffs into the lungs every 6 (six) hours as needed for wheezing. 02/22/22   Cheryl Reusing, FNP  levonorgestrel-ethinyl estradiol (SEASONALE) 0.15-0.03 MG tablet Take 1 tablet by mouth daily. 12/06/21   [provider]  meclizine  (ANTIVERT ) 12.5 MG tablet Take 2 tablets (25 mg total) by mouth 3 (three) times daily as needed for dizziness. 01/13/22   Roemhildt, Lorin T, PA-C  montelukast  (SINGULAIR ) 10 MG tablet Take 1 tablet once at night for coughing or wheezing 02/22/22   Cheryl Reusing, FNP  naproxen  (NAPROSYN ) 500 MG tablet TAKE 1 TABLET BY MOUTH EVERY 12 HOURS FOR TEN DAYS AS NEEDED FOR CRAMPING 11/20/21   [provider]  Olopatadine  HCl (PATADAY ) 0.2 % SOLN 1 drop each eye once a day if needed for itchy eyes 02/22/22   Cheryl Reusing, FNP  ondansetron  (ZOFRAN -ODT) 4 MG disintegrating tablet Take 1 tablet (4 mg total) by mouth every 8 (eight) hours as needed for nausea or vomiting. 03/01/23   Ladora Congress, PA  ondansetron  (ZOFRAN -ODT) 8 MG disintegrating tablet 8mg  ODT q8 hours prn nausea 09/27/24   Darianna Amy, MD  polyethylene glycol powder (GLYCOLAX /MIRALAX ) 17 GM/SCOOP powder  10/03/19   [provider]  ranitidine  (ZANTAC ) 150 MG  capsule Take 1 capsule (150 mg total) by mouth daily. 04/23/18   Horton, Charmaine FALCON, MD  fluticasone  (FLOVENT  HFA) 44 MCG/ACT inhaler Inhale 2 puffs into the lungs 2 (two) times daily. Patient not taking: Reported on 01/29/2018 07/31/17 02/13/20  Asa Aloysius LABOR, MD  levalbuterol  (XOPENEX ) 1.25 MG/3ML nebulizer solution USE ONE UNIT DOSE EVERY 6 HOURS IF NEEDED FOR COUGH OR WHEEZE Patient not taking: Reported on 07/30/2018 01/31/18 02/13/20  Asa Aloysius LABOR, MD    Allergies: Patient has no active allergies.    Review of Systems  Updated Vital Signs Pulse 75   Temp (!) 97.5 F (36.4 C) (Oral)   Resp 18   LMP 09/02/2024    SpO2 100%   Physical Exam Vitals and nursing note reviewed. Exam conducted with a chaperone present.  Constitutional:      General: She is not in acute distress.    Appearance: Normal appearance. She is well-developed.  HENT:     Head: Normocephalic and atraumatic.     Nose: Nose normal.     Mouth/Throat:     Mouth: Mucous membranes are moist.     Pharynx: Oropharynx is clear. No oropharyngeal exudate or posterior oropharyngeal erythema.  Eyes:     Pupils: Pupils are equal, round, and reactive to light.  Cardiovascular:     Rate and Rhythm: Regular rhythm. Tachycardia present.     Pulses: Normal pulses.     Heart sounds: Normal heart sounds.  Pulmonary:     Effort: Pulmonary effort is normal. No respiratory distress.     Breath sounds: Normal breath sounds. No stridor. No wheezing.  Abdominal:     General: Bowel sounds are normal. There is no distension.     Palpations: Abdomen is soft.     Tenderness: There is no abdominal tenderness. There is no guarding or rebound.  Genitourinary:    Vagina: Vaginal discharge present.  Musculoskeletal:        General: Normal range of motion.     Cervical back: Neck supple.  Skin:    General: Skin is dry.     Capillary Refill: Capillary refill takes less than 2 seconds.     Findings: No erythema or rash.  Neurological:     General: No focal deficit present.     Mental Status: She is alert.     Deep Tendon Reflexes: Reflexes normal.  Psychiatric:        Mood and Affect: Mood is anxious.        Judgment: Judgment normal.     (all labs ordered are listed, but only abnormal results are displayed) Results for orders placed or performed during the hospital encounter of 09/30/24  Basic metabolic panel with GFR   Collection Time: 09/30/24  3:24 AM  Result Value Ref Range   Sodium 139 135 - 145 mmol/L   Potassium 3.5 3.5 - 5.1 mmol/L   Chloride 107 98 - 111 mmol/L   CO2 23 22 - 32 mmol/L   Glucose, Bld 105 (H) 70 - 99 mg/dL   BUN 12 6  - 20 mg/dL   Creatinine, Ser 9.26 0.44 - 1.00 mg/dL   Calcium 8.6 (L) 8.9 - 10.3 mg/dL   GFR, Estimated >39 >39 mL/min   Anion gap 9 5 - 15  CBC with Differential/Platelet   Collection Time: 09/30/24  3:24 AM  Result Value Ref Range   WBC 6.9 4.0 - 10.5 K/uL   RBC 4.40 3.87 - 5.11 MIL/uL   Hemoglobin 13.4  12.0 - 15.0 g/dL   HCT 61.5 63.9 - 53.9 %   MCV 87.3 80.0 - 100.0 fL   MCH 30.5 26.0 - 34.0 pg   MCHC 34.9 30.0 - 36.0 g/dL   RDW 87.8 88.4 - 84.4 %   Platelets 263 150 - 400 K/uL   nRBC 0.0 0.0 - 0.2 %   Neutrophils Relative % 45 %   Neutro Abs 3.1 1.7 - 7.7 K/uL   Lymphocytes Relative 42 %   Lymphs Abs 2.9 0.7 - 4.0 K/uL   Monocytes Relative 11 %   Monocytes Absolute 0.8 0.1 - 1.0 K/uL   Eosinophils Relative 2 %   Eosinophils Absolute 0.1 0.0 - 0.5 K/uL   Basophils Relative 0 %   Basophils Absolute 0.0 0.0 - 0.1 K/uL   Immature Granulocytes 0 %   Abs Immature Granulocytes 0.02 0.00 - 0.07 K/uL  hCG, serum, qualitative   Collection Time: 09/30/24  3:24 AM  Result Value Ref Range   Preg, Serum NEGATIVE NEGATIVE   CT Angio Chest PE W and/or Wo Contrast Result Date: 09/30/2024 EXAM: CTA CHEST 09/30/2024 03:45:00 AM TECHNIQUE: CTA of the chest was performed after the administration of intravenous contrast. The study was performed with intravenous contrast. 75 mL of iohexol (OMNIPAQUE) 350 MG/ML injection was administered. Multiplanar reformatted images are provided for review. MIP images are provided for review. Automated exposure control, iterative reconstruction, and/or weight based adjustment of the mA/kV was utilized to reduce the radiation dose to as low as reasonably achievable. COMPARISON: Portable chest from today, PA and lateral chest 06/24/2024. No prior chest CT. CLINICAL HISTORY: Syncope/presyncope, cerebrovascular cause suspected. FINDINGS: PULMONARY ARTERIES: Pulmonary arteries are adequately opacified for evaluation. No acute pulmonary embolus. Main pulmonary artery  is normal in caliber. MEDIASTINUM: The heart and pericardium demonstrate no acute abnormality. No pericardial effusion. There is no acute abnormality of the thoracic aorta. The aorta and great vessels are normal. Normal variant 4 vessel aortic arch with arch origin of the left vertebral artery. There is a small volume of substernal thymus, normal for age. LYMPH NODES: No mediastinal, hilar or axillary lymphadenopathy. LUNGS AND PLEURA: The lungs are without acute process. No focal consolidation or pulmonary edema. No evidence of pleural effusion or pneumothorax. The central airways and trachea are clear. UPPER ABDOMEN: Limited images of the upper abdomen are unremarkable. SOFT TISSUES AND BONES: There is mild lower thoracic levoscoliosis. No acute or significant osseous findings. The visualized chest wall is unremarkable. No acute soft tissue abnormality. IMPRESSION: 1. No chest CT or CTA findings. Electronically signed by: Francis Quam MD 09/30/2024 04:08 AM EST RP Workstation: [id number]   DG Chest Portable 1 View Result Date: 09/30/2024 EXAM: 1 VIEW(S) XRAY OF THE CHEST 09/30/2024 02:20:00 AM COMPARISON: PA and lateral chest 06/24/2024. There has been no significant interval change. CLINICAL HISTORY: cough FINDINGS: LUNGS AND PLEURA: No focal pulmonary opacity. No pleural effusion. No pneumothorax. HEART AND MEDIASTINUM: No acute abnormality of the cardiac and mediastinal silhouettes. BONES AND SOFT TISSUES: There is mild lower thoracic levoscoliosis. IMPRESSION: 1. No acute cardiopulmonary findings. Stable chest. Electronically signed by: Francis Quam MD 09/30/2024 02:40 AM EST RP Workstation: [id number]     Radiology: No results found.   Procedures   Medications Ordered in the ED  acetaminophen  (TYLENOL ) tablet 1,000 mg (has no administration in time range)  ibuprofen  (ADVIL ) tablet 400 mg (has no administration in time range)  lidocaine (LIDODERM) 5 % 1 patch (has no administration in time  range)  Medical Decision Making Patient seen earlier in the week for abdominal pain and headache and nausea and emesis presents with SOB  Amount and/or Complexity of Data Reviewed External Data Reviewed: labs and notes.    Details: Previous visits reviewed  Labs: ordered.    Details: Negative pregnancy test normal sodium 139, normal potassium 3.5, normal creatinine.  Normal white count 6.9, normal hemoglobin 13.4  Radiology: ordered and independent interpretation performed.    Details: Negative CXR by me. Enlarged stomach on CT that is full and constipation on scout film   Risk OTC drugs. Prescription drug management. Risk Details: Given history of covid and pleuritic pain and SOB, decision made to perform CTA to exclude PE.  Patient came back from CT with itching all over.  No swelling of the lips tongue or uvula.  No hives noted, medication for allergic reaction immediately initiated.  Stayed at patient's side.  Multiple airway checks with listening to lungs.  No wheezing no stridor no swelling of the lips tongue or uvula.  Patient is visibly panicking in room.  Patient advised to slow breathing.  HR is responding to guidance and reassurance IV fluids also initiated.  Monitoring. Reassessed, lungs are clear.  No rashes on the skin.  No swelling of the lips tongue or uvula.  No stridor.  Heart rate is improved after using the restroom.  I will treat for allergic reaction with antihistamines, and steroids.  I have advised against vaping.  No signs of anaphylaxis monitored in the ED.  Will start miralax  therapy, instructions on discharge.  Stable for discharge.      Final diagnoses:  None   No signs of systemic illness or infection. The patient is nontoxic-appearing on exam and vital signs are within normal limits.  I have reviewed the triage vital signs and the nursing notes. Pertinent labs & imaging results that were available during my care of the  patient were reviewed by me and considered in my medical decision making (see chart for details). After history, exam, and medical workup I feel the patient has been appropriately medically screened and is safe for discharge home. Pertinent diagnoses were discussed with the patient. Patient was given return precautions.     Meyli Boice, MD 09/30/24 [phone]

## 2024-09-30 NOTE — ED Provider Notes (Addendum)
 Patient HR has increased into the 130s multiple times during evaluation.  Vape pen at side.  Patient denies there is nicotine in this. Patient is anxious on multiple evaluations.  Patient is now complaining of abdominal pain.  Stomach is dilated and full on CT she has been seen multiple times for abdominal pain.  I suspect HR is up and stomach is full secondary to [facility] vape use.  This would also explain anxiety and SOB.  PE has been excluded.     Kaedynce Tapp, MD 09/30/24 [phone]

## 2024-09-30 NOTE — Discharge Instructions (Addendum)
 Miralax  one capful 3 times daily for 7 days then one capful daily thereafter.  Start zyrtec  daily.  Stop vaping

## 2024-09-30 NOTE — ED Triage Notes (Signed)
 Pt. Reports sharp pain in right shoulder and it hurts to breathe started 35 mins pta. Denies wheezing and cough and denies any known injury or lifting.

## 2024-09-30 NOTE — ED Notes (Signed)
 Radiology team member brought patient to department shortly after CT scan noting flushing and itching of the skin. Dr. Nettie notified of possible allergic reaction. Medications orders, provider at the bedside, and new VS obtained. Airway intact at this time and patient speaking in complete sentences.
